# Patient Record
Sex: Male | Born: 1970 | Race: White | Hispanic: No | Marital: Married | Smoking: Never smoker
Health system: Southern US, Community
[De-identification: ages and names within clinical notes are randomized; demographics above are authoritative.]

## PROBLEM LIST (undated history)

## (undated) DIAGNOSIS — M1990 Unspecified osteoarthritis, unspecified site: Secondary | ICD-10-CM

---

## 2007-10-13 ENCOUNTER — Emergency Department: Payer: Self-pay | Admitting: Emergency Medicine

## 2014-10-15 ENCOUNTER — Ambulatory Visit
Admission: EM | Admit: 2014-10-15 | Discharge: 2014-10-15 | Disposition: A | Discharge status: Home / Self Care | Payer: Self-pay | Attending: Family Medicine | Admitting: Family Medicine

## 2014-10-15 ENCOUNTER — Encounter: Payer: Self-pay | Admitting: Emergency Medicine

## 2014-10-15 ENCOUNTER — Ambulatory Visit: Payer: Self-pay

## 2014-10-15 DIAGNOSIS — L03031 Cellulitis of right toe: Secondary | ICD-10-CM

## 2014-10-15 HISTORY — DX: Unspecified osteoarthritis, unspecified site: M19.90

## 2014-10-15 MED ORDER — HYDROCODONE-ACETAMINOPHEN 5-325 MG PO TABS: 1.0000 | ORAL_TABLET | Freq: Four times a day (QID) | ORAL | Status: AC | PRN

## 2014-10-15 MED ORDER — SULFAMETHOXAZOLE-TRIMETHOPRIM 800-160 MG PO TABS: 1.0000 | ORAL_TABLET | Freq: Two times a day (BID) | ORAL | Status: AC

## 2014-10-15 MED ORDER — HYDROCODONE-ACETAMINOPHEN 5-325 MG PO TABS
1.0000 | ORAL_TABLET | Freq: Once | ORAL | Status: AC
  Administered 2014-10-15: 1 via ORAL

## 2014-10-15 MED ORDER — NAPROXEN 500 MG PO TABS: 500.0000 mg | ORAL_TABLET | Freq: Two times a day (BID) | ORAL | Status: AC

## 2014-10-15 NOTE — ED Notes (Signed)
Right great toe pain, swelling and redness which stated Friday

## 2014-10-15 NOTE — ED Provider Notes (Signed)
CSN: [id number]     Arrival date & time 10/15/14  4098 History   First MD Initiated Contact with Patient 10/15/14 1037     No chief complaint on file.  (Consider location/radiation/quality/duration/timing/severity/associated sxs/prior Treatment) HPI Comments: Married caucasian male here for evaluation right big toe pain that started 12 Oct 2014.  Red, hot, swollen using cane to walk today due to pain didn't go into work Friday or today.  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  Has tried 2 extra strength tylenol or motrin 4 tabs po prn and tylenol pm 3 tabs yesterday and getting no relief would like some vicodin today has used in the past for teeth extraction when tylenol or motrin has not worked without GI upset or side effects.  Denied trauma, bug bites, history of gout personal or family.  The history is provided by the patient and the spouse.    Past Medical History  Diagnosis Date  . Arthritis    History reviewed. No pertinent past surgical history. History reviewed. No pertinent family history. Social History  Substance Use Topics  . Smoking status: Never Smoker   . Smokeless tobacco: Never Used  . Alcohol Use: Yes     Comment: occasional    Review of Systems  Constitutional: Negative for fever, chills, diaphoresis, activity change, appetite change and fatigue.  HENT: Negative for congestion, dental problem, drooling, ear discharge, ear pain, facial swelling, hearing loss, mouth sores, nosebleeds, postnasal drip, rhinorrhea, sinus pressure, sneezing, sore throat, tinnitus, trouble swallowing and voice change.   Eyes: Negative for photophobia, pain, discharge, redness, itching and visual disturbance.  Respiratory: Negative for cough, chest tightness, shortness of breath, wheezing and stridor.   Cardiovascular: Negative for chest pain, palpitations and leg swelling.  Gastrointestinal: Negative for nausea, vomiting, abdominal pain, diarrhea, constipation, blood in stool and abdominal  distention.  Endocrine: Negative for cold intolerance and heat intolerance.  Genitourinary: Negative for dysuria and frequency.  Musculoskeletal: Positive for gait problem. Negative for myalgias, back pain, joint swelling, arthralgias, neck pain and neck stiffness.  Skin: Positive for color change and rash. Negative for pallor and wound.  Allergic/Immunologic: Negative for environmental allergies and food allergies.  Neurological: Negative for dizziness, tremors, seizures, syncope, facial asymmetry, speech difficulty, weakness, light-headedness, numbness and headaches.  Hematological: Negative for adenopathy. Does not bruise/bleed easily.  Psychiatric/Behavioral: Positive for sleep disturbance. Negative for behavioral problems, confusion and agitation.    Allergies  Review of patient's allergies indicates no known allergies.  Home Medications   Prior to Admission medications   Medication Sig Start Date End Date Taking? Authorizing Provider  HYDROcodone-acetaminophen (NORCO/VICODIN) 5-325 MG per tablet Take 1 tablet by mouth every 6 (six) hours as needed. 10/15/14   Barbaraann Barthel, NP  naproxen (NAPROSYN) 500 MG tablet Take 1 tablet (500 mg total) by mouth 2 (two) times daily with a meal. 10/15/14   Barbaraann Barthel, NP  sulfamethoxazole-trimethoprim (BACTRIM DS,SEPTRA DS) 800-160 MG per tablet Take 1 tablet by mouth 2 (two) times daily. 10/15/14   Jarold Song Elray Dains, NP   BP 143/85 mmHg  Pulse 77  Temp(Src) 98.3 F (36.8 C) (Oral)  Ht  (1.803 m)  Wt 210 lb (95.255 kg)  BMI 29.30 kg/m2  SpO2 97% Physical Exam  Constitutional: He is oriented to person, place, and time. Vital signs are normal. He appears well-developed and well-nourished. No distress.  HENT:  Head: Normocephalic and atraumatic.  Right Ear: External ear normal.  Left Ear: External ear normal.  Nose: Nose normal.  Mouth/Throat: Oropharynx is clear and moist. No oropharyngeal exudate.  Eyes: Conjunctivae, EOM  and lids are normal. Pupils are equal, round, and reactive to light. Right eye exhibits no discharge. Left eye exhibits no discharge. No scleral icterus.  Neck: Trachea normal and normal range of motion. Neck supple. No tracheal deviation present. No thyromegaly present.  Cardiovascular: Normal rate, regular rhythm, normal heart sounds and intact distal pulses.  Exam reveals no gallop and no friction rub.   No murmur heard. Pulmonary/Chest: Effort normal and breath sounds normal. No stridor. No respiratory distress. He has no wheezes. He has no rales. He exhibits no tenderness.  Abdominal: Soft.  Musculoskeletal: He exhibits edema and tenderness.       Right hip: Normal.       Left hip: Normal.       Right knee: Normal.       Left knee: Normal.       Right ankle: He exhibits normal range of motion, no swelling, no ecchymosis, no deformity, no laceration and normal pulse. Tenderness. Lateral malleolus and medial malleolus tenderness found. No AITFL, no CF ligament, no posterior TFL, no head of 5th metatarsal and no proximal fibula tenderness found. Achilles tendon normal. Achilles tendon exhibits no pain, no defect and normal Thompson's test results.       Left ankle: Normal.       Right upper leg: Normal.       Left upper leg: Normal.       Right lower leg: Normal.       Left lower leg: Normal.       Right foot: There is decreased range of motion, tenderness and swelling. There is no bony tenderness, normal capillary refill, no crepitus, no deformity and no laceration.       Left foot: Normal.       Feet:  Neurological: He is alert and oriented to person, place, and time. He exhibits normal muscle tone. Coordination normal.  Skin: Skin is warm, dry and intact. Rash noted. No abrasion, no bruising, no burn, no ecchymosis, no laceration, no lesion, no petechiae and no purpura noted. Rash is macular. Rash is not papular, not maculopapular, not nodular, not pustular, not vesicular and not  urticarial. He is not diaphoretic. There is erythema. No cyanosis. No pallor. Nails show no clubbing.  Right 1st toe PIP joint anterior only 1cm diameter macular erythematous rash; skin bilateral feet dry, scaly and calloused  Psychiatric: He has a normal mood and affect. His speech is normal and behavior is normal. Judgment and thought content normal. Cognition and memory are normal.  Nursing note and vitals reviewed.   ED Course  Procedures (including critical care time) Labs Review Labs Reviewed - No data to display  Imaging Review Dg Toe Great Right  10/15/2014   CLINICAL DATA:  Right great toe DIP pain for 4 days. No known injury. Redness.  EXAM: RIGHT GREAT TOE  COMPARISON:  None.  FINDINGS: No acute bony abnormality. Specifically, no fracture, subluxation, or dislocation. Soft tissues are intact. Joint spaces are maintained.  IMPRESSION: No acute bony abnormality.   Electronically Signed   By: Charlett Nose M.D.   On: 10/15/2014 11:03   Reviewed [place] controlled substances website no Rx in the past year.  Discussed no alcohol, no driving for 8 hours after taking medication to be used if naproxen 500mg  po BID and ice, elevation not controlling pain.  Patient and spouse verbalized understanding of information/instructions, agreed  with plan of care and had no further questions at this time.  MDM   1. Cellulitis of toe of right foot    Will treat for cellulitis left great toe. Bactrim ds po BID x 7 days.  Naproxen 500mg  po BID for pain. stop motrin.  May also use tylenol 650mg  po QID prn.   Vicodin 1 po q6h prn breakthrough pain.  Elevate, ice.  Rest today.  Work excuse x 24 hours.  [employer] handout on skin infection and gout given to patient.  Discussed with patient dietary modification and naproxen would treat gout pain if this is new gout.  Xray shows toe joints normal patient given copy of radiology report.  RTC if worsening erythema, pain, purulent discharge, swelling or fever.  Wash towels,  washcloths, sheets in hot water with bleach every couple of days until infection resolved.  Patient verbalized understanding, agreed with plan of care and had no further questions at this time.      Barbaraann Barthel, NP 10/15/14 1920

## 2014-10-15 NOTE — Discharge Instructions (Signed)
Cellulitis °Cellulitis is an infection of the skin and the tissue beneath it. The infected area is usually red and tender. Cellulitis occurs most often in the arms and lower legs.  °CAUSES  °Cellulitis is caused by bacteria that enter the skin through cracks or cuts in the skin. The most common types of bacteria that cause cellulitis are staphylococci and streptococci. °SIGNS AND SYMPTOMS  °· Redness and warmth. °· Swelling. °· Tenderness or pain. °· Fever. °DIAGNOSIS  °Your health care provider can usually determine what is wrong based on a physical exam. Blood tests may also be done. °TREATMENT  °Treatment usually involves taking an antibiotic medicine. °HOME CARE INSTRUCTIONS  °· Take your antibiotic medicine as directed by your health care provider. Finish the antibiotic even if you start to feel better. °· Keep the infected arm or leg elevated to reduce swelling. °· Apply a warm cloth to the affected area up to 4 times per day to relieve pain. °· Take medicines only as directed by your health care provider. °· Keep all follow-up visits as directed by your health care provider. °SEEK MEDICAL CARE IF:  °· You notice red streaks coming from the infected area. °· Your red area gets larger or turns dark in color. °· Your bone or joint underneath the infected area becomes painful after the skin has healed. °· Your infection returns in the same area or another area. °· You notice a swollen bump in the infected area. °· You develop new symptoms. °· You have a fever. °SEEK IMMEDIATE MEDICAL CARE IF:  °· You feel very sleepy. °· You develop vomiting or diarrhea. °· You have a general ill feeling (malaise) with muscle aches and pains. °MAKE SURE YOU:  °· Understand these instructions. °· Will watch your condition. °· Will get help right away if you are not doing well or get worse. °Document Released: 11/19/2004 Document Revised: 06/26/2013 Document Reviewed: 04/27/2011 °ExitCare® Patient Information ©2015 [employer].  This information is not intended to replace advice given to you by your health care provider. Make sure you discuss any questions you have with your health care provider. ° °Gout °Gout is an inflammatory arthritis caused by a buildup of uric acid crystals in the joints. Uric acid is a chemical that is normally present in the blood. When the level of uric acid in the blood is too high it can form crystals that deposit in your joints and tissues. This causes joint redness, soreness, and swelling (inflammation). Repeat attacks are common. Over time, uric acid crystals can form into masses (tophi) near a joint, destroying bone and causing disfigurement. Gout is treatable and often preventable. °CAUSES  °The disease begins with elevated levels of uric acid in the blood. Uric acid is produced by your body when it breaks down a naturally found substance called purines. Certain foods you eat, such as meats and fish, contain high amounts of purines. Causes of an elevated uric acid level include: °· Being passed down from parent to child (heredity). °· Diseases that cause increased uric acid production (such as obesity, psoriasis, and certain cancers). °· Excessive alcohol use. °· Diet, especially diets rich in meat and seafood. °· Medicines, including certain cancer-fighting medicines (chemotherapy), water pills (diuretics), and aspirin. °· Chronic kidney disease. The kidneys are no longer able to remove uric acid well. °· Problems with metabolism. °Conditions strongly associated with gout include: °· Obesity. °· High blood pressure. °· High cholesterol. °· Diabetes. °Not everyone with elevated uric acid levels gets   gout. It is not understood why some people get gout and others do not. Surgery, joint injury, and eating too much of certain foods are some of the factors that can lead to gout attacks. °SYMPTOMS  °· An attack of gout comes on quickly. It causes intense pain with redness, swelling, and warmth in a joint. °· Fever  can occur. °· Often, only one joint is involved. Certain joints are more commonly involved: °¨ Base of the big toe. °¨ Knee. °¨ Ankle. °¨ Wrist. °¨ Finger. °Without treatment, an attack usually goes away in a few days to weeks. Between attacks, you usually will not have symptoms, which is different from many other forms of arthritis. °DIAGNOSIS  °Your caregiver will suspect gout based on your symptoms and exam. In some cases, tests may be recommended. The tests may include: °· Blood tests. °· Urine tests. °· X-rays. °· Joint fluid exam. This exam requires a needle to remove fluid from the joint (arthrocentesis). Using a microscope, gout is confirmed when uric acid crystals are seen in the joint fluid. °TREATMENT  °There are two phases to gout treatment: treating the sudden onset (acute) attack and preventing attacks (prophylaxis). °· Treatment of an Acute Attack. °¨ Medicines are used. These include anti-inflammatory medicines or steroid medicines. °¨ An injection of steroid medicine into the affected joint is sometimes necessary. °¨ The painful joint is rested. Movement can worsen the arthritis. °¨ You may use warm or cold treatments on painful joints, depending which works best for you. °· Treatment to Prevent Attacks. °¨ If you suffer from frequent gout attacks, your caregiver may advise preventive medicine. These medicines are started after the acute attack subsides. These medicines either help your kidneys eliminate uric acid from your body or decrease your uric acid production. You may need to stay on these medicines for a very long time. °¨ The early phase of treatment with preventive medicine can be associated with an increase in acute gout attacks. For this reason, during the first few months of treatment, your caregiver may also advise you to take medicines usually used for acute gout treatment. Be sure you understand your caregiver's directions. Your caregiver may make several adjustments to your medicine  dose before these medicines are effective. °¨ Discuss dietary treatment with your caregiver or dietitian. Alcohol and drinks high in sugar and fructose and foods such as meat, poultry, and seafood can increase uric acid levels. Your caregiver or dietitian can advise you on drinks and foods that should be limited. °HOME CARE INSTRUCTIONS  °· Do not take aspirin to relieve pain. This raises uric acid levels. °· Only take over-the-counter or prescription medicines for pain, discomfort, or fever as directed by your caregiver. °· Rest the joint as much as possible. When in bed, keep sheets and blankets off painful areas. °· Keep the affected joint raised (elevated). °· Apply warm or cold treatments to painful joints. Use of warm or cold treatments depends on which works best for you. °· Use crutches if the painful joint is in your leg. °· Drink enough fluids to keep your urine clear or pale yellow. This helps your body get rid of uric acid. Limit alcohol, sugary drinks, and fructose drinks. °· Follow your dietary instructions. Pay careful attention to the amount of protein you eat. Your daily diet should emphasize fruits, vegetables, whole grains, and fat-free or low-fat milk products. Discuss the use of coffee, vitamin C, and cherries with your caregiver or dietitian. These may be helpful in   lowering uric acid levels. °· Maintain a healthy body weight. °SEEK MEDICAL CARE IF:  °· You develop diarrhea, vomiting, or any side effects from medicines. °· You do not feel better in 24 hours, or you are getting worse. °SEEK IMMEDIATE MEDICAL CARE IF:  °· Your joint becomes suddenly more tender, and you have chills or a fever. °MAKE SURE YOU:  °· Understand these instructions. °· Will watch your condition. °· Will get help right away if you are not doing well or get worse. °Document Released: 02/07/2000 Document Revised: 06/26/2013 Document Reviewed: 09/23/2011 °ExitCare® Patient Information ©2015 [employer]. This information  is not intended to replace advice given to you by your health care provider. Make sure you discuss any questions you have with your health care provider. ° °

## 2014-10-18 ENCOUNTER — Ambulatory Visit
Admission: EM | Admit: 2014-10-18 | Discharge: 2014-10-18 | Disposition: A | Discharge status: Home / Self Care | Payer: Self-pay | Attending: Family Medicine | Admitting: Family Medicine

## 2014-10-18 ENCOUNTER — Encounter: Payer: Self-pay | Admitting: Emergency Medicine

## 2014-10-18 ENCOUNTER — Telehealth: Payer: Self-pay

## 2014-10-18 DIAGNOSIS — M109 Gout, unspecified: Secondary | ICD-10-CM

## 2014-10-18 DIAGNOSIS — M1000 Idiopathic gout, unspecified site: Secondary | ICD-10-CM

## 2014-10-18 LAB — CBC WITH DIFFERENTIAL/PLATELET
Basophils Absolute: 0.1 10*3/uL (ref 0–0.1)
Basophils Relative: 1 %
Eosinophils Absolute: 0.1 10*3/uL (ref 0–0.7)
Eosinophils Relative: 2 %
HCT: 45.6 % (ref 40.0–52.0)
Hemoglobin: 15.9 g/dL (ref 13.0–18.0)
Lymphocytes Relative: 26 %
Lymphs Abs: 1.7 10*3/uL (ref 1.0–3.6)
MCH: 30.2 pg (ref 26.0–34.0)
MCHC: 34.9 g/dL (ref 32.0–36.0)
MCV: 86.6 fL (ref 80.0–100.0)
Monocytes Absolute: 0.6 10*3/uL (ref 0.2–1.0)
Monocytes Relative: 10 %
Neutro Abs: 3.9 10*3/uL (ref 1.4–6.5)
Neutrophils Relative %: 61 %
Platelets: 274 10*3/uL (ref 150–440)
RBC: 5.27 MIL/uL (ref 4.40–5.90)
RDW: 12.7 % (ref 11.5–14.5)
WBC: 6.4 10*3/uL (ref 3.8–10.6)

## 2014-10-18 LAB — COMPREHENSIVE METABOLIC PANEL
ALT: 26 U/L (ref 17–63)
AST: 28 U/L (ref 15–41)
Albumin: 4.6 g/dL (ref 3.5–5.0)
Alkaline Phosphatase: 57 U/L (ref 38–126)
Anion gap: 9 (ref 5–15)
BUN: 9 mg/dL (ref 6–20)
CO2: 28 mmol/L (ref 22–32)
Calcium: 9.5 mg/dL (ref 8.9–10.3)
Chloride: 98 mmol/L — ABNORMAL LOW (ref 101–111)
Creatinine, Ser: 1.1 mg/dL (ref 0.61–1.24)
GFR calc Af Amer: >60 mL/min (ref >60)
GFR calc non Af Amer: >60 mL/min (ref >60)
Glucose, Bld: 106 mg/dL — ABNORMAL HIGH (ref 65–99)
Potassium: 4.1 mmol/L (ref 3.5–5.1)
Sodium: 135 mmol/L (ref 135–145)
Total Bilirubin: 0.6 mg/dL (ref 0.3–1.2)
Total Protein: 8.2 g/dL — ABNORMAL HIGH (ref 6.5–8.1)

## 2014-10-18 LAB — URIC ACID: Uric Acid, Serum: 7.1 mg/dL (ref 4.4–7.6)

## 2014-10-18 MED ORDER — KETOROLAC TROMETHAMINE 60 MG/2ML IM SOLN
60.0000 mg | Freq: Once | INTRAMUSCULAR | Status: AC
  Administered 2014-10-18: 60 mg via INTRAMUSCULAR

## 2014-10-18 MED ORDER — COLCHICINE 0.6 MG PO TABS
1.2000 mg | ORAL_TABLET | Freq: Every day | ORAL | Status: DC
  Administered 2014-10-18: 1.2 mg via ORAL

## 2014-10-18 MED ORDER — COLCHICINE 0.6 MG PO TABS: 0.6000 mg | ORAL_TABLET | Freq: Every day | ORAL | Status: AC

## 2014-10-18 MED ORDER — METHYLPREDNISOLONE SODIUM SUCC 125 MG IJ SOLR
125.0000 mg | Freq: Once | INTRAMUSCULAR | Status: AC
  Administered 2014-10-18: 125 mg via INTRAMUSCULAR

## 2014-10-18 NOTE — ED Notes (Signed)
Patient was seen on Monday.  Patient states that his right toe swelling and pain has not gotten better. Patient denies fevers.

## 2014-10-18 NOTE — Discharge Instructions (Signed)

## 2014-10-18 NOTE — ED Provider Notes (Addendum)
CSN: [id number]     Arrival date & time 10/18/14  1127 History   First MD Initiated Contact with Patient 10/18/14 1235     Chief Complaint  Patient presents with  . Toe Pain   (Consider location/radiation/quality/duration/timing/severity/associated sxs/prior Treatment) HPI   This a 44 year old gentleman who had presented on Monday initially to have cellulitis or gout. He had taken the Bactrim DS as well as the Naprosyn states he has not missed any doses but has not had any improvement at all he continues to have right IP joint pain of the great toe that is all red swollen and tender. He is finding it very difficult to ambulate and is now using a cane. He denies any fever or chills.  Past Medical History  Diagnosis Date  . Arthritis    History reviewed. No pertinent past surgical history. History reviewed. No pertinent family history. Social History  Substance Use Topics  . Smoking status: Never Smoker   . Smokeless tobacco: Never Used  . Alcohol Use: Yes     Comment: occasional    Review of Systems  Constitutional: Negative for fever and chills.  Musculoskeletal: Positive for joint swelling and arthralgias.  Skin: Positive for color change.  All other systems reviewed and are negative.   Allergies  Review of patient's allergies indicates no known allergies.  Home Medications   Prior to Admission medications   Medication Sig Start Date End Date Taking? Authorizing Provider  colchicine 0.6 MG tablet Take 1 tablet (0.6 mg total) by mouth daily. Take until pain gone. Restart if pain returns. 10/18/14   Lutricia Feil, PA-C  HYDROcodone-acetaminophen (NORCO/VICODIN) 5-325 MG per tablet Take 1 tablet by mouth every 6 (six) hours as needed. 10/15/14   Barbaraann Barthel, NP  naproxen (NAPROSYN) 500 MG tablet Take 1 tablet (500 mg total) by mouth 2 (two) times daily with a meal. 10/15/14   Barbaraann Barthel, NP  sulfamethoxazole-trimethoprim (BACTRIM DS,SEPTRA DS) 800-160 MG per  tablet Take 1 tablet by mouth 2 (two) times daily. 10/15/14   Jarold Song Betancourt, NP   BP 138/85 mmHg  Pulse 82  Temp(Src) 97.4 F (36.3 C) (Tympanic)  Resp 16  Ht 5\' 11"  (1.803 m)  Wt 240 lb (108.863 kg)  BMI 33.49 kg/m2  SpO2 97% Physical Exam  Constitutional: He is oriented to person, place, and time. He appears well-developed and well-nourished.  HENT:  Head: Atraumatic.  Eyes: EOM are normal. Pupils are equal, round, and reactive to light.  Musculoskeletal: He exhibits edema and tenderness.  Examination of the right great toe shows marked swelling erythema and tenderness of the IP joint. MTP joint is uninvolved. He does not like the joint to be touched withdraw his foot even before contact is made. Dorsal erythema is blanchable peripherally.  Neurological: He is alert and oriented to person, place, and time.  Skin: Skin is warm and dry.  Psychiatric: He has a normal mood and affect. His behavior is normal. Judgment and thought content normal.  Nursing note and vitals reviewed.   ED Course  Procedures (including critical care time) Labs Review Labs Reviewed  COMPREHENSIVE METABOLIC PANEL - Abnormal; Notable for the following:    Chloride 98 (*)    Glucose, Bld 106 (*)    Total Protein 8.2 (*)    All other components within normal limits  CBC WITH DIFFERENTIAL/PLATELET  URIC ACID    Imaging Review No results found. 13:29:04 Orders Acknowledged HM  New - ketorolac (TORADOL)  injection 60 mg      13:29:07 Orders Acknowledged HM  New - methylPREDNISolone sodium succinate (SOLU-MEDROL) 125 mg/2 mL injection 125 mg       MDM   1. Gout of big toe    New Prescriptions   COLCHICINE 0.6 MG TABLET    Take 1 tablet (0.6 mg total) by mouth daily. Take until pain gone. Restart if pain returns.  Plan: 1. Test/x-ray results and diagnosis reviewed with patient 2. rx as per orders; risks, benefits, potential side effects reviewed with patient 3. Recommend supportive  treatment with continued colcrys until pain free. Watch diet. 4. F/u prn if symptoms worsen or don't improve.Needs follow up with PCP.     Lutricia Feil, PA-C 10/18/14 [address], PA-C 10/22/14 [phone]

## 2014-10-18 NOTE — ED Notes (Signed)
Pt states "I can not afford the medication Colchicine the doctor prescribed earlier today for my gout."   Writer informed pt that a message would be sent to prescribing provider.

## 2014-10-19 NOTE — Telephone Encounter (Signed)
Patient was directed by nurse Doristine Section to have the patient check for coupons online. If they're unable to find any online there are some coupons here that they can use.

## 2016-12-12 IMAGING — CR DG TOE GREAT 2+V*R*
3 series · 3 of 3 positions shown · non-contrast
Comparison: None.

CLINICAL DATA: Right great toe DIP pain for 4 days. No known
injury. Redness.

EXAM:
RIGHT GREAT TOE

[toe ap]
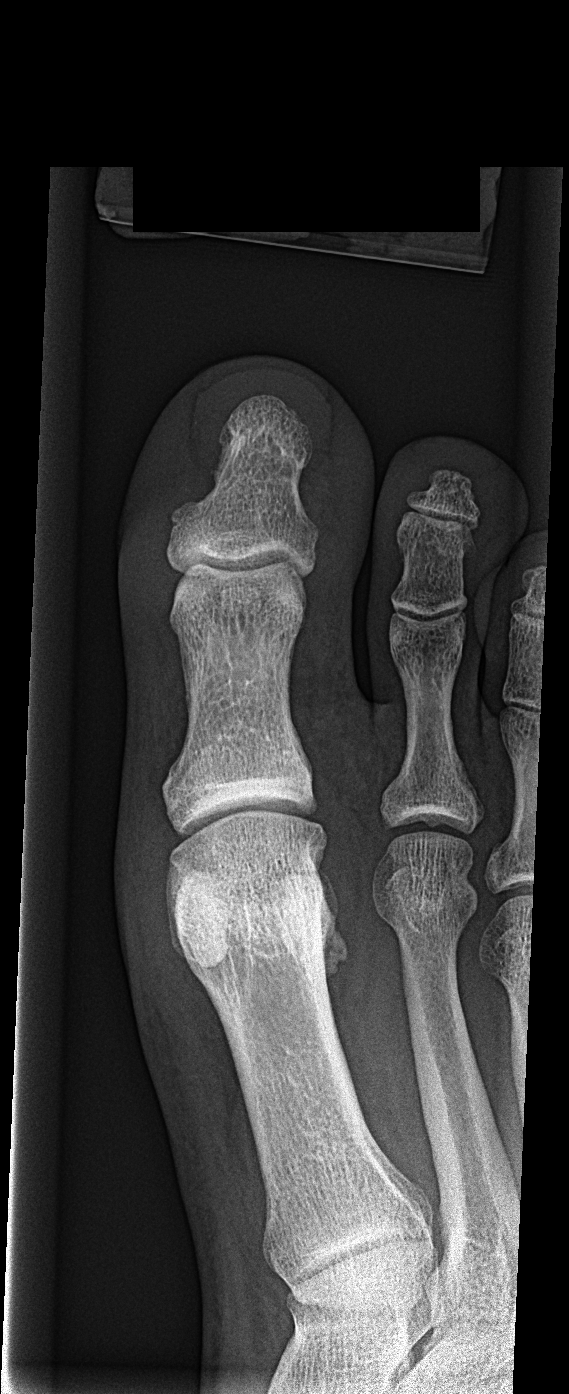

[toe obl]
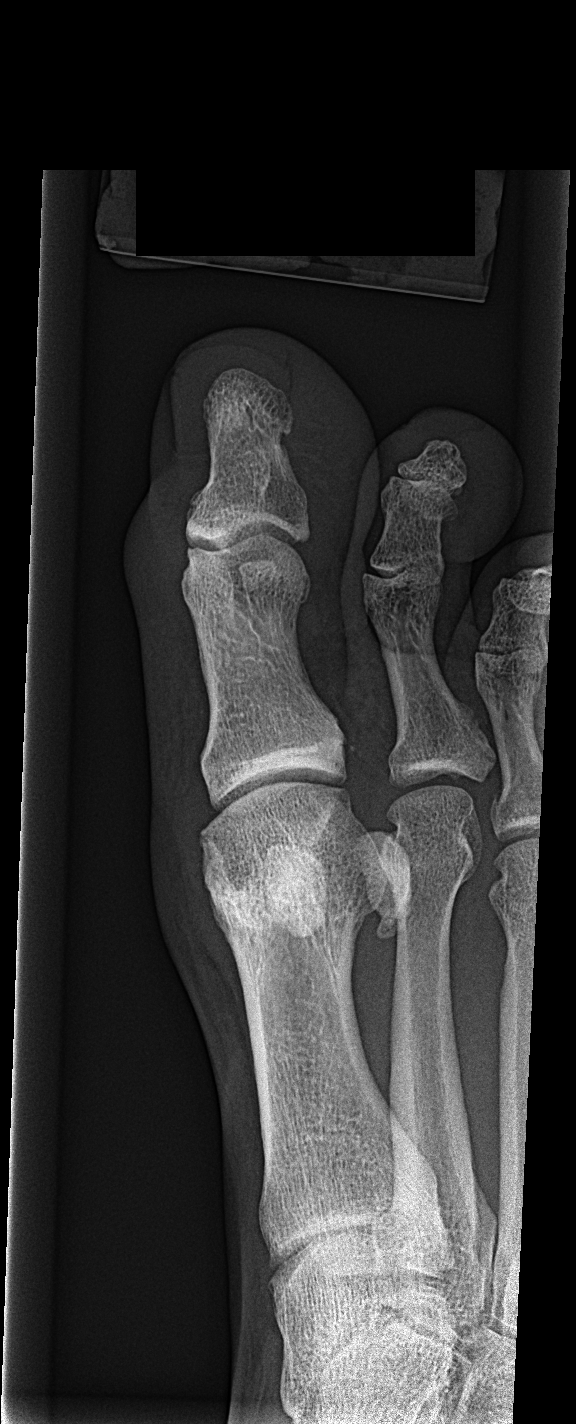

[toe lat]
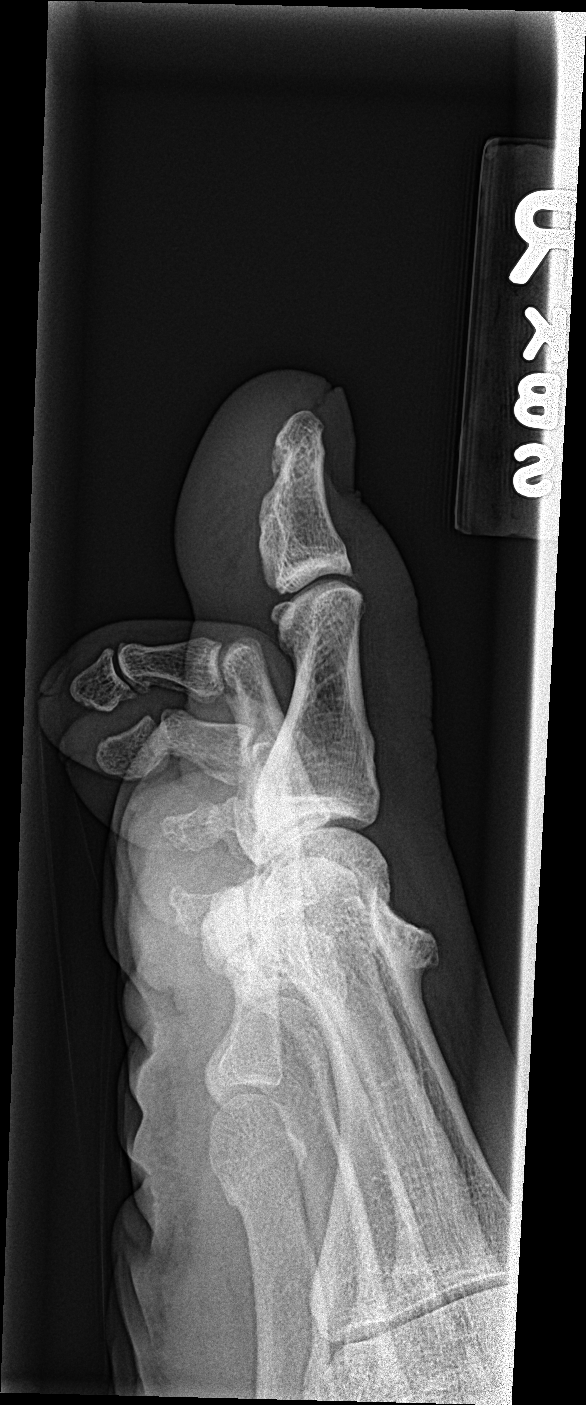

[3 of 3 positions shown; findings below may reference images not displayed]

FINDINGS: No acute bony abnormality. Specifically, no fracture, subluxation,
or dislocation. Soft tissues are intact. Joint spaces are
maintained.
IMPRESSION: No acute bony abnormality.

## 2019-10-17 ENCOUNTER — Other Ambulatory Visit: Payer: Self-pay

## 2019-10-17 ENCOUNTER — Ambulatory Visit
Admission: EM | Admit: 2019-10-17 | Discharge: 2019-10-17 | Disposition: A | Discharge status: Home / Self Care | Payer: Self-pay | Attending: Family Medicine | Admitting: Family Medicine

## 2019-10-17 DIAGNOSIS — M5432 Sciatica, left side: Secondary | ICD-10-CM

## 2019-10-17 MED ORDER — KETOROLAC TROMETHAMINE 60 MG/2ML IM SOLN
60.0000 mg | Freq: Once | INTRAMUSCULAR | Status: AC
  Administered 2019-10-17: 60 mg via INTRAMUSCULAR

## 2019-10-17 MED ORDER — KETOROLAC TROMETHAMINE 60 MG/2ML IM SOLN: 60.0000 mg | Freq: Once | INTRAMUSCULAR | Status: DC

## 2019-10-17 MED ORDER — METAXALONE 800 MG PO TABS: 800.0000 mg | ORAL_TABLET | Freq: Three times a day (TID) | ORAL | 0 refills | Status: AC

## 2019-10-17 MED ORDER — MELOXICAM 15 MG PO TABS: 15.0000 mg | ORAL_TABLET | Freq: Every day | ORAL | 0 refills | Status: AC

## 2019-10-17 NOTE — Discharge Instructions (Addendum)
Avoid symptoms as much as possible.  Limit sitting lifting and bending to avoid aggravating the sciatica.  Take medications as directed.Use  Caution while taking muscle relaxers.  Do not perform activities requiring concentration or judgment and do not drive.  If you are not improving or worsen contact your primary care physician or go to the emergency room

## 2019-10-17 NOTE — ED Provider Notes (Addendum)
[facility]    CSN: [id number] Arrival date & time: 10/17/19  1854      History   Chief Complaint Chief Complaint  Patient presents with  . Back Pain    Sciatic    HPI Nathaniel Lin is a 49 y.o. male.   HPI  49 year old male presents today with lumbar back pain radiation into his left leg posterior thigh lateral calf and foot.  States that started about a week ago after he bought a new truck and he feels that the seating has precipitated it.  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  This seems to exacerbate things.  He denies any bowel or bladder dysfunction.  That it will happen on occasion but usually improves fairly quickly.        Past Medical History:  Diagnosis Date  . Arthritis     There are no problems to display for this patient.   History reviewed. No pertinent surgical history.     Home Medications    Prior to Admission medications   Medication Sig Start Date End Date Taking? Authorizing Provider  colchicine 0.6 MG tablet Take 1 tablet (0.6 mg total) by mouth daily. Take until pain gone. Restart if pain returns. 10/18/14  Yes Lutricia Feil, PA-C  HYDROcodone-acetaminophen (NORCO/VICODIN) 5-325 MG per tablet Take 1 tablet by mouth every 6 (six) hours as needed. 10/15/14  Yes Betancourt, Jarold Song, NP  naproxen (NAPROSYN) 500 MG tablet Take 1 tablet (500 mg total) by mouth 2 (two) times daily with a meal. 10/15/14  Yes Betancourt, Tina A, NP  sulfamethoxazole-trimethoprim (BACTRIM DS,SEPTRA DS) 800-160 MG per tablet Take 1 tablet by mouth 2 (two) times daily. 10/15/14  Yes Betancourt, Jarold Song, NP  meloxicam (MOBIC) 15 MG tablet Take 1 tablet (15 mg total) by mouth daily. Take with food. 10/17/19   Lutricia Feil, PA-C  metaxalone (SKELAXIN) 800 MG tablet Take 1 tablet (800 mg total) by mouth 3 (three) times daily. 10/17/19   Lutricia Feil, PA-C    Family History History reviewed. No pertinent  family history.  Social History Social History   Tobacco Use  . Smoking status: Never Smoker  . Smokeless tobacco: Never Used  Substance Use Topics  . Alcohol use: Yes    Comment: occasional  . Drug use: Not on file     Allergies   Patient has no known allergies.   Review of Systems Review of Systems  Constitutional: Positive for activity change. Negative for appetite change, chills, diaphoresis, fatigue and fever.  Musculoskeletal: Positive for back pain and myalgias.  All other systems reviewed and are negative.    Physical Exam Triage Vital Signs ED Triage Vitals  Enc Vitals Group     BP 10/17/19 2027 (!) 138/96     Pulse Rate 10/17/19 2027 79     Resp 10/17/19 2027 18     Temp 10/17/19 2027 97.9 F (36.6 C)     Temp Source 10/17/19 2027 Oral     SpO2 10/17/19 2027 97 %     Weight 10/17/19 2029 220 lb (99.8 kg)     Height 10/17/19 2029 5\' 11"  (1.803 m)     Head Circumference --      Peak Flow --      Pain Score 10/17/19 2028 6     Pain Loc --      Pain Edu? --  Excl. in GC? --    No data found.  Updated Vital Signs BP (!) 138/96 (BP Location: Right Arm)   Pulse 79   Temp 97.9 F (36.6 C) (Oral)   Resp 18   Ht 5\' 11"  (1.803 m)   Wt 220 lb (99.8 kg)   SpO2 97%   BMI 30.68 kg/m   Visual Acuity Right Eye Distance:   Left Eye Distance:   Bilateral Distance:    Right Eye Near:   Left Eye Near:    Bilateral Near:     Physical Exam Vitals and nursing note reviewed.  Constitutional:      General: He is not in acute distress.    Appearance: Normal appearance. He is not ill-appearing or toxic-appearing.  HENT:     Head: Normocephalic and atraumatic.  Eyes:     Conjunctiva/sclera: Conjunctivae normal.  Musculoskeletal:        General: Tenderness present. Normal range of motion.     Cervical back: Normal range of motion and neck supple.     Comments: Examination of the lumbar spine shows a level pelvis in stance.  He has a normal lordotic  curve.  Range of motion shows him able to forward flex to the level of his ankles with his hands.  He is able to lateral flex fully without much difficulty.  He has mild tenderness over the left sacroiliac joint and no tenderness in the sciatic notch.  He is able to toe and heel walk normally.  He has normal sensation of the lower extremities.  DTRs are 2+/4 and symmetrical.  Straight leg raise testing on the left in the seated position at 90 degrees causes him to have the leg discomfort.  Normal on the right.  Skin:    General: Skin is warm and dry.  Neurological:     General: No focal deficit present.     Mental Status: He is alert and oriented to person, place, and time.  Psychiatric:        Mood and Affect: Mood normal.        Behavior: Behavior normal.        Thought Content: Thought content normal.        Judgment: Judgment normal.      UC Treatments / Results  Labs (all labs ordered are listed, but only abnormal results are displayed) Labs Reviewed - No data to display  EKG   Radiology No results found.  Procedures Procedures (including critical care time)  Medications Ordered in UC Medications  ketorolac (TORADOL) injection 60 mg (60 mg Intramuscular Given 10/17/19 2044)    Initial Impression / Assessment and Plan / UC Course  I have reviewed the triage vital signs and the nursing notes.  Pertinent labs & imaging results that were available during my care of the patient were reviewed by me and considered in my medical decision making (see chart for details).   -year-old male presents with the above symptoms of left-sided sciatica.  Exam shows no neurological red flags.  He states that he has had it for a week noticed after he bought a new car and he thinks the seat may have caused to the sciatica.  Had discussion with him about avoiding symptoms much as possible.  He has excepted an injection of Toradol 60 mg intramuscularly.  I will prescribe Mobic for  anti-inflammatory and Skelaxin for muscle relaxation with appropriate precautions.  He has not improving or worsens he should contact his primary care physician  or go to the emergency room.   Final Clinical Impressions(s) / UC Diagnoses   Final diagnoses:  Sciatica of left side     Discharge Instructions     Avoid symptoms as much as possible.  Limit sitting lifting and bending to avoid aggravating the sciatica.  Take medications as directed.Use  Caution while taking muscle relaxers.  Do not perform activities requiring concentration or judgment and do not drive.  If you are not improving or worsen contact your primary care physician or go to the emergency room    ED Prescriptions    Medication Sig Dispense Auth. Provider   meloxicam (MOBIC) 15 MG tablet Take 1 tablet (15 mg total) by mouth daily. Take with food. 30 tablet Lutricia Feil, PA-C   metaxalone (SKELAXIN) 800 MG tablet Take 1 tablet (800 mg total) by mouth 3 (three) times daily. 21 tablet Lutricia Feil, PA-C     I have reviewed the PDMP during this encounter.   Lutricia Feil, PA-C 10/17/19 2055    Lutricia Feil, PA-C 10/17/19 2056

## 2019-10-17 NOTE — ED Triage Notes (Signed)
Patient in today w/ c/o lumbar pain radiating down his leg to his foot on the left side. Patient states the progression is over approx. 1 wk.

## 2021-09-12 ENCOUNTER — Emergency Department
Admission: EM | Admit: 2021-09-12 | Discharge: 2021-09-23 | Disposition: E | Payer: Self-pay | Attending: Emergency Medicine | Admitting: Emergency Medicine

## 2021-09-12 DIAGNOSIS — R464 Slowness and poor responsiveness: Secondary | ICD-10-CM | POA: Insufficient documentation

## 2021-09-12 DIAGNOSIS — I462 Cardiac arrest due to underlying cardiac condition: Secondary | ICD-10-CM | POA: Insufficient documentation

## 2021-09-12 DIAGNOSIS — I469 Cardiac arrest, cause unspecified: Secondary | ICD-10-CM

## 2021-09-12 MED ORDER — EPINEPHRINE PF 1 MG/ML IJ SOLN
1.0000 mg | Freq: Once | INTRAMUSCULAR | Status: AC
  Administered 2021-09-12: 1 mg via INTRAVENOUS

## 2021-09-23 NOTE — Progress Notes (Signed)
I responded to a page from the nurse to provide spiritual support for the patient's family. I arrived in the ED and after speaking with the nurse, I met with his family in the Family Waiting Room. His family was grieving greatly. I provided pastoral presence, words of comfort, and prayer. I remained with family members as they visited bedside with the patient and provided spiritual support and care.    September 13, 2021 0700  Clinical Encounter Type  Visited With Patient and family together  Visit Type Initial;Spiritual support;Death  Referral From Nurse  Consult/Referral To Chaplain  Spiritual Encounters  Spiritual Needs Prayer;Emotional;Grief support    Chaplain Dr Redgie Grayer

## 2021-09-23 NOTE — ED Notes (Addendum)
Pulse check. Very faint pulses. CPR paused at this time

## 2021-09-23 NOTE — ED Notes (Signed)
CPR started.

## 2021-09-23 NOTE — ED Notes (Signed)
1MG  epi

## 2021-09-23 NOTE — ED Notes (Signed)
Spoke with Ranae Palms at [facility].  States pt should be held for tissue and eye donation, but no eye prep is needed.  Case reference number [id number].

## 2021-09-23 NOTE — ED Triage Notes (Signed)
Pt brought from home via EMS. Per EMS pt was heard by wife gurgling. Wife started CPR. Total down time prior to arrival to hospital was . Total of 6epi, 450 amiodarone, 1g Calcium, and an amp of sodium Bicarb. Total of 20 shocks administered.

## 2021-09-23 NOTE — ED Provider Notes (Signed)
[facility] Provider Note    Event Date/Time   First MD Initiated Contact with Patient 09-20-2021 [phone]     (approximate)   History   Unresponsive   Level 5 caveat:  history/ROS limited by acute/critical illness   HPI  Nathaniel Lin is a 51 y.o. male with no known medical history because the family reports he does not go to the doctor.  He presents with CPR in progress.  The history per paramedics is that his wife noticed at about 3:30 AM that the patient was making a gurgling noise.  She called 911 and realized he was unresponsive and not breathing so she began CPR.  When first responders arrived they found that he was in V-fib arrest.  Reportedly at least 4 defibrillations were performed and CPR continued until the paramedics arrived.  They continue to run the code with the patient going in and out of V-fib to PEA and back to V-fib for more than an hour.  They continued CPR as per algorithm/ACLS, but because he continued to intermittently go into V-fib, they could not call the code in spite of the duration.  Eventually the patient had some agonal respirations and he was in an organized rhythm, so they began transport to [facility].  During transportation they lost pulses and administered additional epi and resumed compressions.  At that point he arrived to [facility], he had received epinephrine 1 mg IV x6 doses, 1 ampoule of sodium bicarb, 1 g of calcium (presumably gluconate), and had received 20 defibrillations.     Physical Exam   Triage Vital Signs: ED Triage Vitals  Enc Vitals Group     BP 09/20/21 0500 (!) 144/117     Pulse Rate 2021-09-20 0500 (!) 109     Resp 20-Sep-2021 0506 (!) 0     Temp --      Temp src --      SpO2 09-20-21 0500 94 %     Weight --      Height --      Head Circumference --      Peak Flow --      Pain Score --      Pain Loc --      Pain Edu? --      Excl. in GC? --     Most recent vital signs: Vitals:   09/20/2021 0503 September 20, 2021  0506  BP: (!) 102/45 (!) 0/0  Pulse:  (!) 0  Resp:  (!) 0  SpO2:  (!) 0%     General: Unresponsive. CV:  Poor peripheral perfusion.  No heart sounds auscultated, initially a week organized rhythm visible on bedside ultrasound.  No palpable pulse in the carotid.  Good pulse palpable during CPR. Resp:  Initially patient had occasional agonal respirations during CPR and for a short period of time after CPR concluded.  Clear bilateral breath sounds upon bag-valve-mask respirations. Abd:  Some abdominal distention.  Other:  GCS 3.  Pupils fixed and dilated.  No corneal reflex, no gag reflex.  Per EMS, end-tidal CO2 in the 70s.    ED Results / Procedures / Treatments   Labs (all labs ordered are listed, but only abnormal results are displayed) Labs Reviewed - No data to display   EKG  Initially sinus rhythm on the monitor, quickly became bradycardic and went into asystole     PROCEDURES:  Critical Care performed: Yes, see critical care procedure note(s)  CPR  Date/Time: 20-Sep-2021 5:39 AM  Performed by: Loleta Rose, MD Authorized by: Loleta Rose, MD  CPR Procedure Details:    ACLS/BLS initiated by EMS: Yes     CPR/ACLS performed in the ED: Yes     Duration of CPR (minutes):  15   Outcome: Pt declared dead    CPR performed via ACLS guidelines under my direct supervision.  See RN documentation for details including defibrillator use, medications, doses and timing. Comments:     CPR/ACLS performed for approx 95 minutes prior to arrival at [facility].  Another round of CPR performed at [facility], directed by me, after patient lost pulses. .Critical Care  Performed by: Loleta Rose, MD Authorized by: Loleta Rose, MD   Critical care provider statement:    Critical care time (minutes):  40   Critical care time was exclusive of:  Separately billable procedures and treating other patients   Critical care was necessary to treat or prevent imminent or life-threatening deterioration of  the following conditions:  Cardiac failure, circulatory failure, CNS failure or compromise and respiratory failure   Critical care was time spent personally by me on the following activities:  Development of treatment plan with patient or surrogate, evaluation of patient's response to treatment, examination of patient, obtaining history from patient or surrogate, ordering and performing treatments and interventions, ordering and review of laboratory studies, ordering and review of radiographic studies, pulse oximetry, re-evaluation of patient's condition and review of old charts    MEDICATIONS ORDERED IN ED: Medications  EPINEPHrine (ADRENALIN) 1 mg (1 mg Intravenous Given 09/18/2021 0457)     IMPRESSION / MDM / ASSESSMENT AND PLAN / ED COURSE  I reviewed the triage vital signs and the nursing notes.                              Differential diagnosis includes, but is not limited to, myocardial infarction, pulmonary embolism, ischemic CVA, hemorrhagic CVA, medication or drug side effect.  Patient's presentation is most consistent with acute presentation with potential threat to life or bodily function.  Patient had been unresponsive and in cardiopulmonary arrest for 95 minutes by the time he arrived in the emergency department.  Initially he had agonal respirations and an organized sinus rhythm on the monitor even though it was very difficult to palpate a pulse; there may have been a faint femoral pulse appreciated.  However, the patient had received epinephrine 1 mg IV just prior to arrival in the emergency department, and very quickly the patient became pulseless.  We performed another round of CPR including epinephrine 1 mg IV and chest compressions.  After the round of CPR, we could not palpate a pulse but he had an organized rhythm on the monitor (PEA).  He had a few agonal respirations.  Bedside ultrasound revealed weak cardiac activity.  The patient very quickly went into asystole with no  cardiac activity visible, palpable, nor auscultated.  Given that the paramedics had already performed heroic efforts in an attempt to resuscitate him (total downtime was more than 100 minutes at that point), I determined that continuing CPR was medically futile.  ED staff, including second ED MD, which were present in the room agreed with this assessment.  I auscultated for heart sounds and we visualize his heart for approximately 1 minute and verify there was no additional activity.  I called time of death at 5:06 AM.  Brooke Dare airway was left in place for the medical examiner, as was the  peripheral access.  Multiple family members were present in the family room, including his wife, children, and parents.  I informed them of the patient's death and they were appropriately distraught.  Chaplain present.  They will be brought to the patient's bedside when they are ready.   Clinical Course as of 23-Sep-2021 0548  Fri 09/23/21  0524 Spoke by phone with medical examiner Mr. Coble.  Discussed the case including the lack of primary care.  Medical examiner agree that this is an appropriate ME case. [CF]    Clinical Course User Index [CF] Loleta Rose, MD     FINAL CLINICAL IMPRESSION(S) / ED DIAGNOSES   Final diagnoses:  Cardiopulmonary arrest [facility])     Rx / DC Orders   ED Discharge Orders     None        Note:  This document was prepared using Dragon voice recognition software and may include unintentional dictation errors.   Loleta Rose, MD 2021/09/23 [phone]

## 2021-09-23 NOTE — ED Notes (Addendum)
Time of death 17. Called by York Cerise. Pulses lost. No synchronized rhythm noted on ultrasound

## 2021-09-23 DEATH — deceased
